# Patient Record
Sex: Female | Born: 1945 | Race: White | Hispanic: No | Marital: Married
Health system: Southern US, Community
[De-identification: ages and names within clinical notes are randomized; demographics above are authoritative.]

---

## 2004-11-13 ENCOUNTER — Ambulatory Visit: Payer: Self-pay | Admitting: General Practice

## 2013-10-02 ENCOUNTER — Ambulatory Visit: Payer: Self-pay | Admitting: Internal Medicine

## 2013-10-27 ENCOUNTER — Inpatient Hospital Stay: Payer: Self-pay | Admitting: Internal Medicine

## 2013-10-27 LAB — COMPREHENSIVE METABOLIC PANEL
AST: 29 U/L (ref 15–37)
Albumin: 3.4 g/dL (ref 3.4–5.0)
Alkaline Phosphatase: 100 U/L
BILIRUBIN TOTAL: 0.3 mg/dL (ref 0.2–1.0)
BUN: 12 mg/dL (ref 7–18)
CALCIUM: 9.6 mg/dL (ref 8.5–10.1)
CREATININE: 0.7 mg/dL (ref 0.60–1.30)
Chloride: 86 mmol/L — ABNORMAL LOW (ref 98–107)
Co2: >45 mmol/L (ref 21–32)
EGFR (Non-African Amer.): >60
GLUCOSE: 173 mg/dL — AB (ref 65–99)
Osmolality: 272 (ref 275–301)
Potassium: 3.2 mmol/L — ABNORMAL LOW (ref 3.5–5.1)
SGPT (ALT): 18 U/L (ref 12–78)
Sodium: 134 mmol/L — ABNORMAL LOW (ref 136–145)
TOTAL PROTEIN: 7.9 g/dL (ref 6.4–8.2)

## 2013-10-27 LAB — CBC
HCT: 40.3 % (ref 35.0–47.0)
HGB: 12.4 g/dL (ref 12.0–16.0)
MCH: 29.6 pg (ref 26.0–34.0)
MCHC: 30.9 g/dL — ABNORMAL LOW (ref 32.0–36.0)
MCV: 96 fL (ref 80–100)
Platelet: 342 10*3/uL (ref 150–440)
RBC: 4.21 10*6/uL (ref 3.80–5.20)
RDW: 13.6 % (ref 11.5–14.5)
WBC: 16.8 10*3/uL — ABNORMAL HIGH (ref 3.6–11.0)

## 2013-10-27 LAB — TROPONIN I: TROPONIN-I: 0.04 ng/mL

## 2013-10-28 LAB — CBC WITH DIFFERENTIAL/PLATELET
BASOS PCT: 0.3 %
Basophil #: 0 10*3/uL (ref 0.0–0.1)
EOS ABS: 0 10*3/uL (ref 0.0–0.7)
Eosinophil %: 0 %
HCT: 36 % (ref 35.0–47.0)
HGB: 11.5 g/dL — ABNORMAL LOW (ref 12.0–16.0)
Lymphocyte #: 0.2 10*3/uL — ABNORMAL LOW (ref 1.0–3.6)
Lymphocyte %: 1.9 %
MCH: 30.8 pg (ref 26.0–34.0)
MCHC: 32 g/dL (ref 32.0–36.0)
MCV: 96 fL (ref 80–100)
MONO ABS: 0.2 x10 3/mm (ref 0.2–0.9)
MONOS PCT: 2 %
Neutrophil #: 9.3 10*3/uL — ABNORMAL HIGH (ref 1.4–6.5)
Neutrophil %: 95.8 %
Platelet: 227 10*3/uL (ref 150–440)
RBC: 3.74 10*6/uL — ABNORMAL LOW (ref 3.80–5.20)
RDW: 13.6 % (ref 11.5–14.5)
WBC: 9.8 10*3/uL (ref 3.6–11.0)

## 2013-10-28 LAB — BASIC METABOLIC PANEL
ANION GAP: 3 — AB (ref 7–16)
BUN: 18 mg/dL (ref 7–18)
CALCIUM: 8.9 mg/dL (ref 8.5–10.1)
CHLORIDE: 87 mmol/L — AB (ref 98–107)
CREATININE: 1.09 mg/dL (ref 0.60–1.30)
Co2: 42 mmol/L (ref 21–32)
EGFR (African American): >60
EGFR (Non-African Amer.): 52 — ABNORMAL LOW
Glucose: 213 mg/dL — ABNORMAL HIGH (ref 65–99)
OSMOLALITY: 273 (ref 275–301)
Potassium: 4.3 mmol/L (ref 3.5–5.1)
SODIUM: 132 mmol/L — AB (ref 136–145)

## 2013-10-28 LAB — MAGNESIUM: Magnesium: 1.8 mg/dL

## 2013-10-29 LAB — BASIC METABOLIC PANEL
Anion Gap: 1 — ABNORMAL LOW (ref 7–16)
BUN: 25 mg/dL — AB (ref 7–18)
CALCIUM: 8.5 mg/dL (ref 8.5–10.1)
Chloride: 98 mmol/L (ref 98–107)
Co2: 41 mmol/L (ref 21–32)
Creatinine: 1.04 mg/dL (ref 0.60–1.30)
EGFR (African American): >60
EGFR (Non-African Amer.): 55 — ABNORMAL LOW
Glucose: 166 mg/dL — ABNORMAL HIGH (ref 65–99)
Osmolality: 288 (ref 275–301)
Potassium: 4.6 mmol/L (ref 3.5–5.1)
SODIUM: 140 mmol/L (ref 136–145)

## 2013-10-29 LAB — CBC WITH DIFFERENTIAL/PLATELET
BASOS ABS: 0 10*3/uL (ref 0.0–0.1)
Basophil %: 0.3 %
EOS ABS: 0 10*3/uL (ref 0.0–0.7)
EOS PCT: 0 %
HCT: 33.3 % — ABNORMAL LOW (ref 35.0–47.0)
HGB: 10.3 g/dL — AB (ref 12.0–16.0)
Lymphocyte #: 0.4 10*3/uL — ABNORMAL LOW (ref 1.0–3.6)
Lymphocyte %: 3.3 %
MCH: 29.9 pg (ref 26.0–34.0)
MCHC: 31.1 g/dL — ABNORMAL LOW (ref 32.0–36.0)
MCV: 96 fL (ref 80–100)
MONO ABS: 0.4 x10 3/mm (ref 0.2–0.9)
Monocyte %: 3.4 %
Neutrophil #: 10.6 10*3/uL — ABNORMAL HIGH (ref 1.4–6.5)
Neutrophil %: 93 %
Platelet: 229 10*3/uL (ref 150–440)
RBC: 3.46 10*6/uL — ABNORMAL LOW (ref 3.80–5.20)
RDW: 13.7 % (ref 11.5–14.5)
WBC: 11.5 10*3/uL — ABNORMAL HIGH (ref 3.6–11.0)

## 2013-11-01 LAB — CULTURE, BLOOD (SINGLE)

## 2013-11-02 ENCOUNTER — Ambulatory Visit: Payer: Self-pay | Admitting: Internal Medicine

## 2013-11-02 LAB — PLATELET COUNT: Platelet: 245 10*3/uL (ref 150–440)

## 2013-11-03 ENCOUNTER — Inpatient Hospital Stay: Payer: Self-pay | Admitting: Internal Medicine

## 2013-12-02 ENCOUNTER — Ambulatory Visit: Payer: Self-pay | Admitting: Internal Medicine

## 2013-12-02 DEATH — deceased

## 2014-09-25 NOTE — H&P (Signed)
PATIENT NAME:  Emma Ramos, Emma Ramos MR#:  [id number] DATE OF BIRTH:  03/15/1946  DATE OF ADMISSION:  11/03/2013  PRIMARY CARE PHYSICIAN: [facility].  REQUESTING PHYSICIAN:  Dr. Janalyn Harder.   CHIEF COMPLAINT: Shortness of breath and agitation.   HISTORY OF PRESENT ILLNESS: The patient is a 69 year old female with known history of end-stage lung disease (BiPAP dependent) who was discharged earlier today to hospice home, got agitated and was feeling the BiPAP was not working. And after discussion with family and hospice staff there, they decided to bring her back to the hospital. She was evaluated by palliative care and hospice person here and they were adamant to get readmitted here with decision to go back to hospice at a later date.   CLINICAL CONDITION:  She is on BiPAP at this time.   PAST MEDICAL HISTORY: 1.  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  2.  COPD.  3.  Chronic respiratory failure. 4.  Hypertension.   PAST SURGICAL HISTORY: Ruptured viscus status post surgery.   ALLERGIES:  PENICILLIN.  FAMILY HISTORY: Noncontributory.   REVIEW OF SYSTEMS:  Unable to obtain as the patient is on BiPAP.  MEDICATION AT HOME PER DISCHARGE SUMMARY: Are as follows: 1.  Ipratropium inhalation as needed.  2.  Spiriva once daily.  3.  Flonase inhaled as needed. 4.  Lorazepam 1 mg every 6 hours orally as needed.  5.  Acetaminophen 325 mg p.o. every 4 hours as needed. 6.  Morphine 20 mg per mL, 0.25 to 0.5 mL orally every 1 to 2 hours as needed.  7.  Prednisone 60 mg p.o. daily.   PHYSICAL EXAMINATION: VITAL SIGNS: Temperature 97.5, heart rate 122 per minute, respirations 20 per minute, blood pressure 167/85 mmHg.  She is saturating 92% on BiPAP. GENERAL:  The patient is a 69 year old female lying in the bed in acute respiratory distress.  EYES: Pupils equal, round, reactive to light and accommodation. No scleral icterus. Extraocular muscles intact.  HEENT: Head atraumatic,  normocephalic. Oropharynx and nasopharynx dry and clear, has BiPAP in place.  NECK: Supple. No jugular venous distention.  Show no thyroid enlargement or tenderness.  Trachea midline.  LUNGS: Positive use of accessory muscles on respiration. Poor air movement with expiratory wheezing throughout both lungs. No rales or rhonchi.  CARDIOVASCULAR: S1, S2 normal. Tachycardic. No murmurs, rubs, gallops. ABDOMEN:  Soft, nontender, nondistended. Hyperactive bowel sounds. No organomegaly appreciated.  EXTREMITIES: No pedal edema, cyanosis, clubbing. NEUROLOGIC: Cranial nerves II through XII intact.  Muscle strength 5/5 in all extremities. Sensation intact.  PSYCHIATRIC: The patient is alert and oriented x 3.  SKIN: No obvious rash, lesion or ulcer.  MUSCULOSKELETAL: No joint effusion.  No tenderness.   LABORATORY PANEL:  Was not checked.   IMPRESSION AND PLAN: 1.  Acute on chronic hypercarbic respiratory failure (BiPAP dependent), now being readmitted disposition plan has been re-evaluated tomorrow morning.  Palliative care and hospice has already seen the patient. The family wants to give time at least overnight until decision can be made for respiration tomorrow. We will keep her on BiPAP as is, with comfort care orders. We will hold off any further laboratories.  2.  Metabolic encephalopathy due to hypercapnia, likely now at baseline. 3.  End-stage chronic obstructive pulmonary disease requiring BiPAP.  Will continue as is.   CODE STATUS: DO NOT RESUSCITATE/comfort care.  TOTAL TIME TAKING CARE OF THIS PATIENT: 40 minutes    ____________________________ Ladarius Seubert S. Sherryll Burger, MD vss:ce D: 11/03/2013 17:01:21  ET T: 11/03/2013 17:41:41 ET JOB#: [id number]  cc: Rissa Turley S. Sherryll BurgerShah, MD, <Dictator> Primary care physician Patricia PesaVIPUL S Danyl Deems MD ELECTRONICALLY SIGNED 11/05/2013 9:56

## 2014-09-25 NOTE — Consult Note (Signed)
   Comments   I was called by nurse regarding pt's request to drink/eat. Came to room and spoke with patient in the presence of her daughter-in-law. Pt says she wants to continue BIPAP but also wants to eat/drink. She is unlikely to be tolerate staying off bipap long and we talked about the risks of aspiration associated with oral intake. Patient would like to assume this risk and drink for comfort. Family agreeable. Instructions left with RN.   Electronic Signatures: Garyn Waguespack, Daryl EasternJoshua R (NP)  (Signed 28-May-15 15:22)  Authored: Palliative Care Phifer, Harriett SineNancy (MD)  (Signed 28-May-15 21:02)  Authored: Palliative Care   Last Updated: 28-May-15 21:02 by Phifer, Harriett SineNancy (MD)

## 2014-09-25 NOTE — H&P (Signed)
PATIENT NAME:  Emma Ramos, Emma Ramos MR#:  [id number] DATE OF BIRTH:  02/10/1946  DATE OF ADMISSION:  10/27/2013  [facility]   REFERRING PHYSICIAN: Dr. Dorothea GlassmanPaul Malinda.   CHIEF COMPLAINT: Brought in by family for altered mental status.   HISTORY OF PRESENT ILLNESS: The patient is a 69 year old Caucasian female with end-stage COPD on 4 liters or more of oxygen around-the-clock and under hospice for a couple of years. The patient currently is on BiPAP, minimally responsive. The patient is breathing spontaneously but is not withdrawing to painful stimuli. Of note, she has severe acidosis and severe hypercapnia, pH of 7.11, and pCO2 of 120. There are multiple family members in the room. Of note, the patient has been having progressive decline for the last several months, more acutely over the last couple of days, poor p.o. intake. The patient, of note, has had no intake of food for the last 2 days. The patient has a cough, not able to bring any sputum up. The patient has been using morphine and lorazepam for palliation. This morning the patient did received a dose or 2 of morphine and her shortness of breath persisted. Then later in the morning, she was noted to have progressively decreased mentation. She was brought into the hospital by her family and she was noted to have severe hypercapnic respiratory failure, currently is on BiPAP, and is unable to provide any history. Hospitalist services were contacted for further evaluation and management. Prior to admission I discussed the case with the family to see what their expectations are given her overall poor prognosis, end-stage lung disease, and still not waking up despite 3-1/2 hours of BiPAP. They stated that they do not want to take off the BiPAP at this time, are okay with antibiotics, but she is to be DNR and they are okay to talk to palliative care tomorrow.   PAST MEDICAL HISTORY: Per family:  1. End-stage lung disease, on 4 liters of  oxygen.  2. COPD.  3. Chronic respiratory failure.  4. Hypertension.   PAST SURGICAL HISTORY: Ruptured viscus status post surgery.   ALLERGIES: PENICILLIN.   FAMILY HISTORY: Unable to obtain at this time.   OUTPATIENT MEDICATIONS: The family did not know but I spoke to a family member who was still in the house who read some of her medications for me. She is on: Lorazepam 0.5 mg unknown frequency, oxycodone, Advair 25/50 at 1 puff 2 times a day, Spiriva 1 cap daily, omeprazole 20 mg daily, morphine sulfate liquid 100 mg per 5 mL unknown frequency, nebulizers, prednisone 5 mg daily, citalopram 5 mg daily, hydrochlorothiazide 25 mg daily, aspirin 81 mg daily, Mucinex and Dulcolax p.r.n.  REVIEW OF SYSTEMS: Unable to fully obtain as the patient is minimally responsive.   PHYSICAL EXAMINATION:  VITAL SIGNS: Temperature on arrival 97.8, pulse rate 140, respiratory rate 19, blood pressure 121/88, oxygen saturation 97% on BiPAP. GENERAL: Currently, the patient is lying in bed. Rapid shallow respirations on BiPAP.  HEENT: Normocephalic, atraumatic. Pupils are equal about 3 mm bilateral, dry mucous membranes.  NECK: Supple. No significant cervical lymphadenopathy.  CARDIOVASCULAR: S1, S2, tachycardic. No significant murmurs appreciated.  LUNGS: Very diminished breath sounds in all lung fields.  ABDOMEN: Soft, nontender, nondistended. Positive bowel sounds in all quadrants.  EXTREMITIES: No pitting edema.  NEUROLOGIC: Breathing spontaneously but hard to arouse.  PSYCHIATRIC: On BiPAP, very hard to arouse.  SKIN: No obvious rashes or lesions.   LABORATORIES AND IMAGING: Glucose 173, BUN  12, creatinine 0.7, sodium 134, potassium 3.2, serum chloride 86, serum CO2 greater than 45. LFTs within normal limits. Troponin negative. White count of 16.8, hemoglobin 12.4, platelets are 342,000, ABG as above. X-ray of the chest showing right lower lobe pneumonia superimposed on chronic lung disease including  emphysema.   ASSESSMENT AND PLAN: We have a 69 year old with end-stage chronic obstructive pulmonary disease under hospice care for a couple of years, hypertension, who was brought in by family for altered mental status.   The patient appears to have severe sepsis per criteria with leukocytosis, tachycardia on arrival, evidence for pneumonia, and acute on chronic hypercapnic respiratory failure due to acute chronic obstructive pulmonary disease exacerbation likely, and pneumonia. At this point the patient's family does not want to take off the BiPAP as they feel like they would be indirectly killing in the patient. They want BiPAP to go on for now and treat the pneumonia, which I think is fair. However, the patient is not pulling in a good tidal volume and has not responded significantly after 3-1/2 hours of BiPAP. I would obtain another ABG in the next half hour and I have discussed the case with our respiratory therapist and we have brought down the FiO2 from 60% to 35%. Would obtain a pulmonary consult, start the patient on high-dose steroids, nebulizers, and given the overall situation, obtain a palliative care consult as well and I did discuss that there is a high likelihood that the patient to might not improve with BiPAP. The patient's family is aware and they have stated multiple times that she is to be DNR but the husband thought that she had always stated that she did not want to go to a hospice home. At this point, I cannot resume Spiriva or Advair as I do not think that she can take deep breaths. I would do Flovent. I would start her on some fluids with dextrose, start Levaquin for the pneumonia. Blood cultures have been ordered. Would get another ABG in the morning to trend the CO2. The patient does have severe respiratory acidosis, which is likely due to a multifactorial process including COPD exacerbation, pneumonia, and opiates might have played a role. I will make her n.p.o. at this point. The  patient is overall critically ill and at high risk of cardiopulmonary arrest and death and the family is aware.   CODE STATUS: The patient is DNR.  TOTAL CRITICAL CARE TIME SPENT: 60 minutes.     ____________________________ Krystal Eaton, MD sa:lt D: 10/27/2013 21:24:43 ET T: 10/27/2013 22:00:41 ET JOB#: [id number]  cc: Krystal Eaton, MD, <Dictator> Krystal Eaton MD ELECTRONICALLY SIGNED 12/01/2013 12:42

## 2014-09-25 NOTE — Discharge Summary (Signed)
PATIENT NAME:  Emma HazardMOORE, Linette MR#:  [id number] DATE OF BIRTH:  [birth year]  DATE OF ADMISSION:  10/27/2013 DATE OF DISCHARGE:  11/03/2013  The patient was discharged to hospice home.   DISCHARGE DIAGNOSES:  1. Acute on chronic respiratory failure with sepsis and chronic obstructive pulmonary disease exacerbation and pneumonia.  2. Hypokalemia.  3. Metabolic encephalopathy.   MEDICATIONS ON DISCHARGE:  1. Ipratropium inhalation solution.  2. Spiriva 18 mcg inhalation once a day.  3. Fluticasone nasal spray twice a day.  4. Lorazepam 1 mg oral tablet every 6 hours as needed for agitation.  5. Morphine oral concentrated solution. Take 0.25 mL every 1-2 hours as needed for pain or dyspnea.    HISTORY OF PRESENTING ILLNESS: A 69 year old Caucasian female with end-stage COPD on 4 liters came to the Emergency Room, was brought by family because of altered mental status. Had respiratory distress and so started on BiPAP by Emergency Room. Initially, she was noted to have pH of 7.1, pCO2 of 120 in the Emergency Room. She was on hospice services at home and has been using morphine and Ativan for palliation. She was do not resuscitate and so continued on BiPAP in Emergency Room.   HOSPITAL COURSE AND STAY:  Admitted to CCU for acute on chronic respiratory failure and severe respiratory acidosis, possible pneumonia and sepsis, and COPD exacerbation and started on treatment with IV steroids, nebulizers, and antibiotics. The patient continued to remain on BiPAP and could not come off the BiPAP.  Palliative care consult was called in and family finally agreed to send her to hospice home because of her BiPAP dependent respiratory failure, metabolic encephalopathy due to respiratory acidosis and pneumonia. Not much improvement, continued on BiPAP. Finally, family decided to take her to hospice home.   CONSULTS IN HOSPITAL: Pulmonary with Dr. Freda MunroSaadat Khan. Palliative care, Dr. Harriett SineNancy Phifer.   IMPORTANT  LABORATORY RESULTS: WBC count  on presentation, hemoglobin 10.3, creatinine 1.04, sodium 140, potassium 4.6, CO2 is 41, pH 7.24, pCO2 is 106, and pO2 is 53. Chest x-ray, portable on admission, was right lower lobe pneumonia superimposed on chronic lung disease including emphysema. Blood cultures, no growth.   TOTAL TIME SPENT ON THIS DISCHARGE: 35 minutes.    ____________________________ Hope PigeonVaibhavkumar G. Elisabeth PigeonVachhani, MD vgv:dd D: 11/05/2013 18:16:59 ET T: 11/06/2013 02:09:32 ET JOB#: [id number]  cc: Hope PigeonVaibhavkumar G. Elisabeth PigeonVachhani, MD, <Dictator> Altamese DillingVAIBHAVKUMAR Hyatt Capobianco MD ELECTRONICALLY SIGNED 11/18/2013 9:02

## 2014-09-25 NOTE — Consult Note (Signed)
PATIENT NAME:  Emma HazardMOORE, Ruey MR#:  [id number] DATE OF BIRTH:  November 21, 1945  DATE OF CONSULTATION:  10/28/2013  CONSULTING PHYSICIAN:  Yevonne PaxSaadat A. Khan, MD  I asked to see the patient for acute respiratory failure.   A 69 year old female who has COPD on chronic oxygen therapy at 4 liters years. The patient was admitted to the ICU. She was on BiPAP initially. She had presented as being unresponsive. Now, she is actually verbal and she is able to follow commands. On her initial admission, her pH was 7.11, pCO2 of 120.  She was completely unresponsive. Palliative care was asked to see the patient and she was made DNR, and the patient is now still DNR and apparently already has hospice arrangements, according to her significant other in the room.   PAST MEDICAL HISTORY: Significant for COPD, chronic respiratory failure, hypertension,   PAST SURGICAL HISTORY:   She has had a ruptured viscus.   ALLERGIES: PENICILLIN.   FAMILY HISTORY: Noncontributory.   MEDICATIONS: Reviewed on the electronic medical record.   REVIEW OF SYSTEMS:  Complete review of systems  is unremarkable other than what is noted above in the HPI.   PHYSICAL EXAMINATION: GENERAL:  At the time she was evaluated, she was awake, appeared to be comfortable.  VITAL SIGNS: Temp of 99.7, pulse 110, respiratory rate 21, blood pressure 110/66.  NECK: Supple. No JVD. No adenopathy. No thyromegaly.  CHEST: Showed no rales or rhonchi. Expansion was equal. Overall diminished breath sounds.  CARDIOVASCULAR: S1, S2 is normal. Regular rhythm. No gallop or rub.  ABDOMEN: Soft and nontender.  NEUROLOGIC: She was awake, appropriate and responsive.   LABORATORY DATA: The patient's blood gas was 7.31, 99 and 70. BUN 18, creatinine 1.09, sodium 132, K of 4.3. Serum CO2 was 42. White count 9.8, hemoglobin 11.5.   IMPRESSION: 1.  Acute on chronic respiratory failure.   PLAN:  Her prognosis is quite poor. She has actually already be seen by hospice  and she will be managed by hospice as an outpatient. We will continue with supportive care and continue with BiPAP for now. I spoke to the family and the patient, and they are agreeable with the current plan. I will make further recommendations as necessary.    ____________________________ Yevonne PaxSaadat A. Khan, MD sak:dmm D: 10/28/2013 19:12:10 ET T: 10/28/2013 21:17:40 ET JOB#: [id number]  cc: Yevonne PaxSaadat A. Khan, MD, <Dictator> Yevonne PaxSAADAT A KHAN MD ELECTRONICALLY SIGNED 11/14/2013 18:05

## 2014-09-25 NOTE — Discharge Summary (Signed)
PATIENT NAME:  Emma HazardMOORE, Aveen MR#:  [id number] DATE OF BIRTH:  05-Aug-1945  DATE OF ADMISSION:  11/03/2013  DATE OF DISCHARGE:  11/04/2013  ADMITTING DIAGNOSIS:  Acute on chronic respiratory failure.  DISCHARGE DIAGNOSES:  1.  Acute on chronic hypoxic hypercarbic respiratory failure.  2.  Encephalopathy due to hypoxic hypercarbia. 3.  Severe respiratory acidosis due to hypercarbia.  4.  Acute chronic obstructive pulmonary disease exacerbation as well as pneumonia. 5.  History of lung disease, oxygen dependent, chronic respiratory failure, COPD as well as hypertension.  DISCHARGE CONDITION:  Poor.  DISCHARGE MEDICATIONS: The patient is to continue ipratropium inhalation as needed, tiotropium inhalations daily, fluticasone 220 mcg one inhalation twice daily, lorazepam 1 mg every 6 hours as needed, Tylenol 325 mg every 4 hours as needed, Motrin 0.25 mL to 0.5 mL, which would be 5 to 10 mg, every 1 to 2 hours as needed, prednisone 60 mg p.o. daily, BiPAP at settings of 14/5, FiO2 45%.   RECOMMENDATIONS:  The patient is being discharged to hospice. She is DNR.   REASON FOR ADMISSION:  The patient is a 69 year old, Caucasian female with past medical history significant for history of chronic respiratory failure, history of COPD, who presented to the hospital recently with pneumonia, sepsis picture. She was not doing too well over a period of time, and she was discharged to hospice. Apparently the patient became agitated, and there was concern that BiPAP was not working, so she was brought to the Emergency Room and was admitted again. On arrival to the Emergency Room, the patient's temperature is 97.5, pulse 122, respiratory rate was 20, blood pressure 167/85, saturation was 92% on BiPAP. Physical exam revealed use of accessory muscles, poor air movement, with expiratory wheezing throughout both lungs, but no rales or rhonchi. Otherwise, no other abnormalities were found.   HOSPITAL COURSE: The patient  was admitted to the hospital with acute on chronic hypercarbic respiratory failure for re-evaluation and possibly care as well as hospice consultation. The patient was admitted continued on BiPAP. She was initiated on morphine as well as Ativan as needed, and she calmed down. She was evaluated by hospice as well as palliative care, and decision was made to discharge her to hospice home today, on 11/04/2013. On the day of discharge, the patient felt satisfactory, comfortable. She was resting during her physical exam. Her vitals were stable, with temperature of 97.6, pulse was ranging from 100 to 110s, respiration rate was 20s to 24, blood pressure 130/76, saturation was 96% on BiPAP. The patient is being discharged to hospice or final management of her chronic condition.   The patient's radiologic studies during this admission included chest x-ray done on 11/03/2013, which showed persistent right basilar infiltrate, with small pleural effusion.  Time spent:  40 minutes.    ____________________________ Katharina Caperima Vada Swift, MD rv:mr D: 11/04/2013 19:22:40 ET T: 11/04/2013 20:53:51 ET JOB#: [id number]  cc: Katharina Caperima Eliah Marquard, MD, <Dictator> Alta Goding MD ELECTRONICALLY SIGNED 11/19/2013 11:10

## 2015-09-05 IMAGING — CR DG CHEST 1V PORT
1 series · 1 of 1 positions shown · non-contrast
Comparison: None.

CLINICAL DATA: 68-year-old female with respiratory distress.
Chronic obstructed pulmonary disease. Initial encounter.

EXAM:
PORTABLE CHEST - 1 VIEW

[ap]
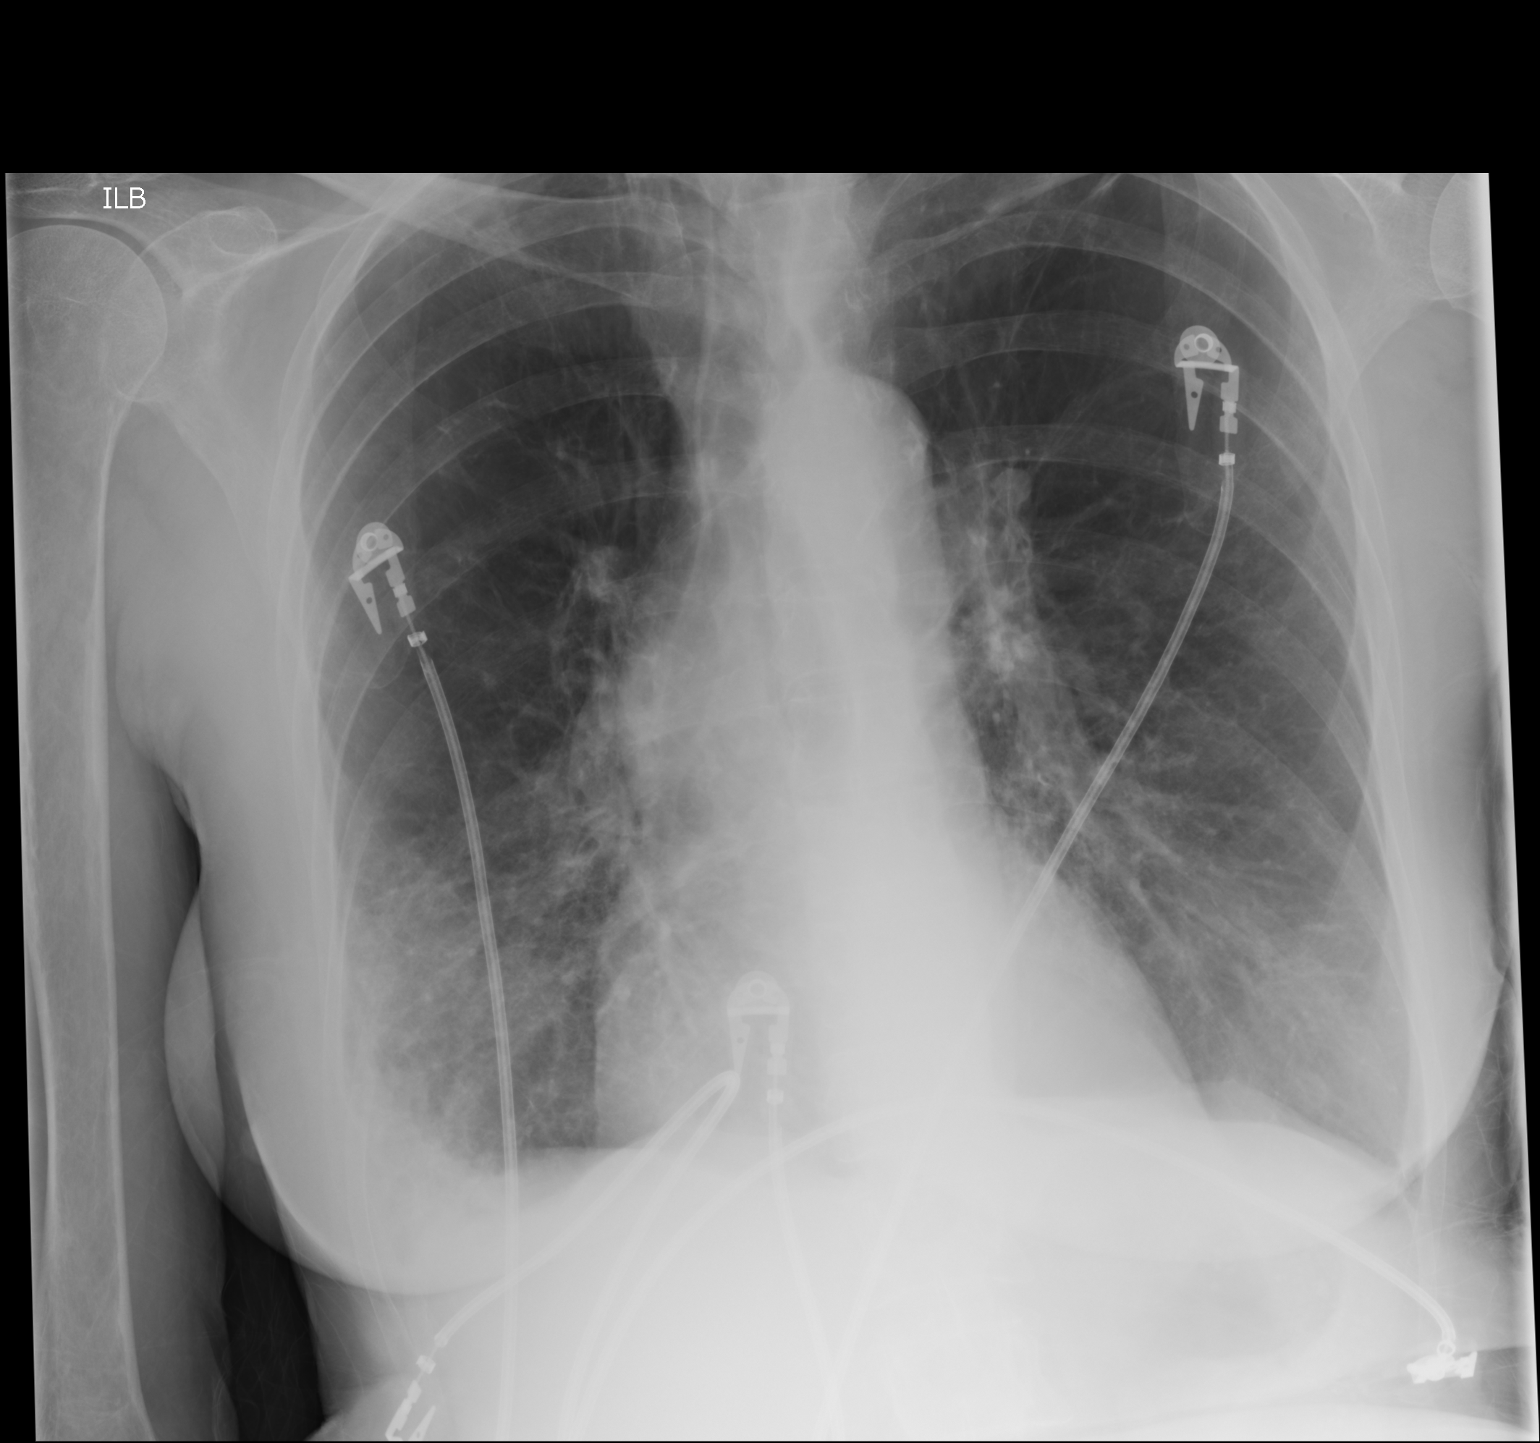

[1 of 1 positions shown; findings below may reference images not displayed]

FINDINGS: Portable AP upright view at 5505 hrs. Streaky bilateral infrahilar
opacity, confluent at the right lung base. No definite effusion.
Attenuated bronchovascular markings in the upper lobes in keeping
with emphysema. No pneumothorax or edema. Normal cardiac size and
mediastinal contours. Visualized tracheal air column is within
normal limits. Partially calcified apical scarring.
IMPRESSION: Right lower lobe pneumonia superimposed on chronic lung disease
including emphysema. Post treatment radiographs recommended to
document resolution.
